# Patient Record
Sex: Male | Born: 1960 | Race: White | Marital: Single | Smoking: Never smoker
Health system: Southern US, Community
[De-identification: ages and names within clinical notes are randomized; demographics above are authoritative.]

---

## 2014-12-12 ENCOUNTER — Other Ambulatory Visit: Payer: Self-pay | Admitting: Family Medicine

## 2014-12-12 ENCOUNTER — Ambulatory Visit
Admission: RE | Admit: 2014-12-12 | Discharge: 2014-12-12 | Disposition: A | Discharge status: Home / Self Care | Payer: Managed Care, Other (non HMO) | Source: Ambulatory Visit | Attending: Family Medicine | Admitting: Family Medicine

## 2014-12-12 DIAGNOSIS — M79605 Pain in left leg: Secondary | ICD-10-CM

## 2014-12-12 DIAGNOSIS — M7918 Myalgia, other site: Secondary | ICD-10-CM

## 2016-03-22 ENCOUNTER — Ambulatory Visit (INDEPENDENT_AMBULATORY_CARE_PROVIDER_SITE_OTHER): Payer: Managed Care, Other (non HMO) | Admitting: Sports Medicine

## 2016-03-22 ENCOUNTER — Encounter: Payer: Self-pay | Admitting: Sports Medicine

## 2016-03-22 VITALS — BP 154/92 | HR 69 | Ht 71.0 in | Wt 185.0 lb

## 2016-03-22 DIAGNOSIS — M5416 Radiculopathy, lumbar region: Secondary | ICD-10-CM | POA: Diagnosis not present

## 2016-03-22 NOTE — Progress Notes (Signed)
Subjective:    Patient ID: Leroy Tyler, male    DOB: 07/31/1961, 55 y.o.   MRN: [id number]  HPI   CC: left buttock pain  Leroy Tyler is a 55-y.o. male who presents with a 2-year history of left buttock pain.  Symptoms first began in 2015; he denies any trauma or inciting injury related to pain's onset.  Since that time, he has attempted to treat his symptoms with physical therapy, massage therapy, dry needling, and NSAIDs.  Over the past 3 weeks, he feels that his symptoms have not gotten any better despite these treatments.  Associated symptoms include occasional numbness and tingling, which radiates from his buttock down into his left calf, foot, and toes.  He denies any overt low back pain.  Symptoms are worsened by bending forward (i.e., bending over to put his pants on in the morning) and bending down to get into his car.  He enjoys playing tennis and hiking, though he notices that his pain and paresthesias are often exacerbated during the first 15 to 20 minutes of a hike.  He additionally reports that he has previously been told that he may have a limb length discrepancy; his physical therapist has noticed that his left shoulder appears to sit higher than his right shoulder.  Denies swelling of the left lower extremity, bruising, or erythema.  No bowel or bladder dysfunction.  Currently take Naproxen (1 to 2 tablets per day) with some symptom relief.  Past medical history significant for HTN. Past surgical history unremarkable. Medications reviewed. Allergies reviewed.   Review of Systems As noted in HPI.    Objective:   Physical Exam  Well-developed, well-nourished.  Alert and oriented x3.  Vital signs reviewed.  Left hip: Normal seated range of motion without discomfort.  Left lower extremity is otherwise neurovascularly intact. Lumbar spine:  Full range of motion without discomfort.  Mild tenderness to palpation over left SI joint and down into left buttock.  No palpable spasm of  the paraspinal muscles.  Negative seated straight leg raise bilaterally. Neuro: Patellar tendon and Achilles tendon reflexes 1+ bilaterally.  Quadriceps, hamstrings, and calf pain 5/5 bilaterally.  Diminished extensor hallucis longus strength on left compared to right, indicative of potential L5 nerve root involvement.  Light touch sensation intact and equal. Pes cavus bilaterally.  X-rays of the lumbar spine from February 2016 are fairly unremarkable. He does have some mild facet arthropathy in the lower lumbar spine but otherwise no significant degenerative changes are seen. Disc spaces are well preserved.    Assessment & Plan:   Left buttock pain likely secondary to lumbar radiculopathy  Advised patient to continue physical therapy with focus on lumbar extension exercises and avoidance of lumbar flexion; given new script to take to his physical therapist.  Discussed potential for additional imaging (MRI) in the future if his symptoms do not improve, given that imaging at this time would not change our management of his symptoms.  He may continue to use Naproxen for his symptoms until his next visit in clinic. He was cautioned about potential GI upset.  Patient would likely also benefit from orthotic insoles at some point given his high arches on exam; he plans to purchase insoles at [employer] later today.  Return to clinic in 3 to 4 weeks for follow-up.  Jenna E. Anola Gurney, M.D.   Patient seen and evaluated with the resident. I agree with the above plan of care. Patient will continue with physical therapy but will avoid  flexion exercises. May continue with other activity using pain as his guide. Follow-up with me in 3-4 weeks. If symptoms persist, then I would consider further diagnostic imaging in anticipation of referral for possible lumbar ESI. Total time spent with the patient was 30 minutes with greater than 50% of the time spent in face-to-face consultation discussing his diagnosis and  treatment.

## 2016-03-29 ENCOUNTER — Ambulatory Visit: Payer: Managed Care, Other (non HMO) | Admitting: Sports Medicine

## 2016-04-19 ENCOUNTER — Encounter: Payer: Self-pay | Admitting: Sports Medicine

## 2016-04-19 ENCOUNTER — Ambulatory Visit (INDEPENDENT_AMBULATORY_CARE_PROVIDER_SITE_OTHER): Payer: Managed Care, Other (non HMO) | Admitting: Sports Medicine

## 2016-04-19 VITALS — BP 134/103 | Ht 71.0 in | Wt 185.0 lb

## 2016-04-19 DIAGNOSIS — M5416 Radiculopathy, lumbar region: Secondary | ICD-10-CM

## 2016-04-19 NOTE — Progress Notes (Signed)
   Subjective:    Patient ID: Leroy AprilScott Tyler, male    DOB: 1960/12/29, 55 y.o.   MRN: [id number]  HPI   Patient comes in today for follow-up on lumbar radiculopathy. Overall, he states that he feels about 75% better since his office visit on May 15. He has been concentrating on a home [employer] exercise program and, as a result, his pain has improved quite a bit. He still gets some occasional numbness and tingling along the lateral aspect of his left lower leg and into his foot but it does not last long. He has not noticed any weakness. Overall, he is pleased with his progress to date.    Review of Systems     Objective:   Physical Exam  Well-developed, well-nourished. Sitting comfortably In exam room.  Negative straight leg raise bilaterally. There is still a slight amount of weakness with great toe extension on the left (4+/5) when compared to the right but it is improved from his previous visit. No atrophy. Sensation is intact to light touch grossly.      Assessment & Plan:   Improving low back pain and left leg sciatica likely secondary to lumbar radiculopathy  Patient has improved by about 75% with a [employer]enzie home exercise program. For the time being he would like to just continue with his home exercises and I think that is certainly reasonable. We did discuss the possibility of formal physical therapy, acupuncture, and chiropractic treatment down the road if needed. We also discussed the possibility of an MRI if symptoms do not continue to improve. I think he is okay to continue with activity as tolerated but will avoid repetitive or prolonged lumbar flexion. Patient will follow-up with me as needed.

## 2016-04-26 ENCOUNTER — Ambulatory Visit: Payer: Managed Care, Other (non HMO) | Admitting: Sports Medicine

## 2017-12-26 ENCOUNTER — Other Ambulatory Visit: Payer: Self-pay | Admitting: Family Medicine

## 2017-12-26 DIAGNOSIS — I779 Disorder of arteries and arterioles, unspecified: Secondary | ICD-10-CM

## 2017-12-26 DIAGNOSIS — I739 Peripheral vascular disease, unspecified: Principal | ICD-10-CM

## 2017-12-30 ENCOUNTER — Ambulatory Visit
Admission: RE | Admit: 2017-12-30 | Discharge: 2017-12-30 | Disposition: A | Discharge status: Home / Self Care | Payer: Managed Care, Other (non HMO) | Source: Ambulatory Visit | Attending: Family Medicine | Admitting: Family Medicine

## 2017-12-30 DIAGNOSIS — I739 Peripheral vascular disease, unspecified: Principal | ICD-10-CM

## 2017-12-30 DIAGNOSIS — I779 Disorder of arteries and arterioles, unspecified: Secondary | ICD-10-CM

## 2019-06-08 ENCOUNTER — Other Ambulatory Visit: Payer: Self-pay

## 2019-06-08 DIAGNOSIS — Z20822 Contact with and (suspected) exposure to covid-19: Secondary | ICD-10-CM

## 2019-06-10 LAB — NOVEL CORONAVIRUS, NAA: SARS-CoV-2, NAA: DETECTED — AB

## 2020-04-22 ENCOUNTER — Ambulatory Visit (INDEPENDENT_AMBULATORY_CARE_PROVIDER_SITE_OTHER): Payer: 59 | Admitting: Sports Medicine

## 2020-04-22 ENCOUNTER — Other Ambulatory Visit: Payer: Self-pay

## 2020-04-22 VITALS — BP 138/88 | Ht 70.0 in | Wt 180.0 lb

## 2020-04-22 DIAGNOSIS — G8929 Other chronic pain: Secondary | ICD-10-CM

## 2020-04-22 DIAGNOSIS — M25512 Pain in left shoulder: Secondary | ICD-10-CM

## 2020-04-22 NOTE — Progress Notes (Addendum)
   Leroy Tyler is a 59 y.o. male who presents to [facility] today for the following:  Left shoulder pain Patient presenting for new onset left shoulder pain.  Has been present for 6 months now.  States he denies any trauma to the area but did notice that around that time he was increasing his push-ups daily.  Due to coronavirus he was unable to go to the gym so has been doing more home workouts.  States that it is not painful all the time but only when he moves his arm up above 90 degrees.  States it is around his triceps.  Has not tried any over-the-counter medications as he does not like to take pills.  Has not used any muscle rub creams.  Has not tried any ice or heat.  Plays golf a few times a year but is right-handed.  Does play tennis weekly but is right-handed and has a right single handed backswing so only uses his left arm to toss the ball up for service.  Did move in March 2020 but that was before the pain started.  PMH reviewed. None pertinent  ROS as above. Medications reviewed.  Exam:  BP 138/88   Ht 5\' 10"  (1.778 m)   Wt 180 lb (81.6 kg)   BMI 25.83 kg/m  Gen: Well NAD MSK: Shoulder: Inspection reveals no obvious deformity, atrophy, or asymmetry. No bruising. No swelling Palpation is normal with no TTP over [facility] joint or bicipital groove. Full ROM in flexion, abduction, somewhat limited external rotation of left arm. Limited internal rotation  NV intact distally Normal scapular function observed. Special Tests:  - Impingement: Neg Hawkins, neers, empty can sign. - Supraspinatous: Negative empty can.  5/5 strength with resisted flexion at 20 degrees - Infraspinatous/Teres Minor: 5/5 strength with ER - Subscapularis: 5/5 strength with IR - Biceps tendon: Negative Speeds, Yerrgason's  - Labrum: Negative Obriens - AC Joint: Negative cross arm - Negative apprehension test - No painful arc and no drop arm sign  Assessment and Plan: 1) Left shoulder pain Patient presenting with  left shoulder pain.  Normal range of motion and good strength.  Rotator cuff special testing was negative so unlikely the etiology.  Likely arthritis versus adhesive capsulitis especially given limited external rotation.  Will need x-ray imaging to differentiate.  Advised home exercise program at this time.  Advised that he avoid any weightbearing exercises at this time.  Advised follow-up in 1 month.  [facility]. [facility] plans to call patient with x-ray results to inform them of further recommendations.   Margaretha Sheffield, PGY-3 [facility] Resident  04/22/2020 2:50 PM   Patient seen and evaluated with the resident.  I agree with the above plan of care.  Patient definitely has decreased external rotation to suggest either glenohumeral osteoarthritis or adhesive capsulitis.  We will get an x-ray to rule out osteoarthritis and I will call him with those results when available.  If unremarkable, then we could consider ultrasound to rule out rotator cuff pathology although his clinical exam today does not suggest that.  Alternatively, we could consider formal physical therapy as well.  Addendum: X-rays unremarkable.  No evidence of glenohumeral osteoarthritis.  Patient will start a home exercise program to help increase range of motion.  Follow-up in 4 weeks.  If no improvement, consider formal physical therapy.

## 2020-04-22 NOTE — Assessment & Plan Note (Signed)
Patient presenting with left shoulder pain.  Normal range of motion and good strength.  Rotator cuff special testing was negative so unlikely the etiology.  Likely arthritis versus adhesive capsulitis especially given limited external rotation.  Will need x-ray imaging to differentiate.  Advised home exercise program at this time.  Advised that he avoid any weightbearing exercises at this time.  Advised follow-up in 1 month.  Dr. Margaretha Sheffield plans to call patient with x-ray results to inform them of further recommendations.

## 2020-04-23 ENCOUNTER — Ambulatory Visit
Admission: RE | Admit: 2020-04-23 | Discharge: 2020-04-23 | Disposition: A | Discharge status: Home / Self Care | Payer: 59 | Source: Ambulatory Visit | Attending: Sports Medicine | Admitting: Sports Medicine

## 2020-04-23 DIAGNOSIS — M25512 Pain in left shoulder: Secondary | ICD-10-CM

## 2020-05-29 ENCOUNTER — Ambulatory Visit: Payer: 59 | Admitting: Sports Medicine

## 2020-06-10 ENCOUNTER — Ambulatory Visit (INDEPENDENT_AMBULATORY_CARE_PROVIDER_SITE_OTHER): Payer: 59 | Admitting: Sports Medicine

## 2020-06-10 ENCOUNTER — Other Ambulatory Visit: Payer: Self-pay

## 2020-06-10 ENCOUNTER — Telehealth: Payer: 59 | Admitting: Sports Medicine

## 2020-06-10 VITALS — BP 126/86 | Ht 71.0 in | Wt 185.0 lb

## 2020-06-10 DIAGNOSIS — M5489 Other dorsalgia: Secondary | ICD-10-CM

## 2020-06-10 MED ORDER — MELOXICAM 15 MG PO TABS
ORAL_TABLET | ORAL | 0 refills | Status: AC
Start: 2020-06-10 — End: ?

## 2020-06-10 NOTE — Progress Notes (Signed)
   Subjective:    Patient ID: Frederic Tones, male    DOB: [birth year], 59 y.o.   MRN: [id number]  HPI Mr. Weisgerber is a 59 yo M with a history of disc herniation with lumbar radiculopathy here for pain in both of his hamstrings. He states the pain started on July 9th when he drove down 8 hours to [place] and accidentally had the lumbar support in his carseat fully extended the entire time. The following day, he noticed his lower back and hamstrings hurt. He saw his PCP on July 16th who said it might have been a pinched nerve and prescribed prednisone, which helped the pain. Currently, he feels his hamstrings are about 60-70% better, with no pain at rest but soreness with certain movements such as standing for long periods of time or getting up from a seated position. He also has soreness when he leans back on his yoga ball. Denies numbness or tingling down his legs, radiation of the pain to his back or down the legs, urinary or fecal incontinence.   Review of Systems As above.    Objective:   Physical Exam Gen: well-appearing, in no acute distress, sitting comfortably on exam table Resp: breathing comfortably at rest MSK: 5/5 strength with hip flexion, extension, abduction, and adduction bilaterally, 5/5 strength with dorsiflexion, plantarflexion, and extension of halluces bilaterally. Trace patellar reflexes bilaterally, 2+ Achilles reflexes bilaterally. Pain in lower back and hamstrings with back extension, no pain with back flexion. Pain in hamstrings with standing up from exam table.  Lumbar spine x-ray in 2016 showed mild facet disease in the lower lumbar spine.    Assessment & Plan:  Mr. Duthie is a 59 yo M with a history of disc herniation with lumbar radiculopathy here for bilateral hamstring pain after a long drive with his back extended from his lumbar support, found to have pain worse with back extension. 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 Given the pain worse with back extension and previous evidence of facet disease on previous x-ray, patient's symptoms are likely due to facet arthropathy rather than disc herniation, likely caused by the drive down to [place] with his lumbar support extending his lower back for several hours. Patient also does not have radicular symptoms like he had with his previous disc herniation.   Plan: -Recommend William's flexion exercises for lumbar spine -Will prescribe meloxicam 15 mg daily as needed for pain -Follow-up as needed

## 2021-06-15 ENCOUNTER — Other Ambulatory Visit (HOSPITAL_COMMUNITY): Payer: Self-pay | Admitting: Family Medicine

## 2021-06-17 ENCOUNTER — Ambulatory Visit (HOSPITAL_COMMUNITY): Payer: 59

## 2021-06-24 ENCOUNTER — Ambulatory Visit (HOSPITAL_COMMUNITY)
Admission: RE | Admit: 2021-06-24 | Discharge: 2021-06-24 | Disposition: A | Discharge status: Home / Self Care | Payer: 59 | Source: Ambulatory Visit | Attending: Family Medicine | Admitting: Family Medicine

## 2021-06-24 ENCOUNTER — Other Ambulatory Visit: Payer: Self-pay

## 2021-06-24 DIAGNOSIS — Z136 Encounter for screening for cardiovascular disorders: Secondary | ICD-10-CM | POA: Insufficient documentation

## 2021-06-24 DIAGNOSIS — E785 Hyperlipidemia, unspecified: Secondary | ICD-10-CM | POA: Insufficient documentation

## 2023-03-30 IMAGING — CT CT CARDIAC CORONARY ARTERY CALCIUM SCORE
2 series · 16 of 20 positions shown, 18 images · non-contrast
Comparison: None.
COMPARISON: None.

Addendum:
EXAM:
OVER-READ INTERPRETATION  CT CHEST

The following report is an over-read performed by radiologist Dr.
Bajso Nannu [REDACTED] on 06/24/2021. This over-read
does not include interpretation of cardiac or coronary anatomy or
pathology. The calcium score interpretation by the cardiologist is
attached.
CLINICAL DATA: Risk stratification
Coronary Calcium Score
TECHNIQUE: The patient was scanned on a Siemens Somatom 64 slice scanner. Axial
non-contrast 3 mm slices were carried out through the heart. The
data set was analyzed on a dedicated work station and scored using
the Agatson method.

[Series 3: 2 hrt calcium · axial · 0.35mm/px · z∈[-276,-183]mm · 8 of 41 slices shown, 10 images]
[im 5/41  vessel]
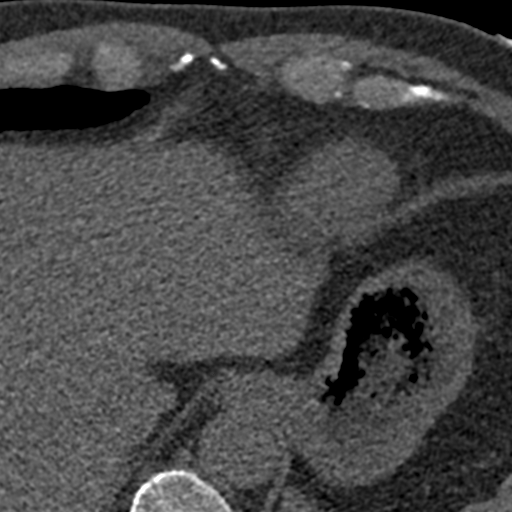
[im 5/41  lung]
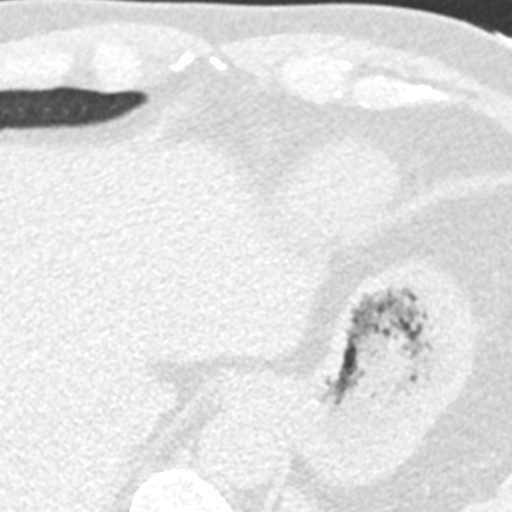
[im 9/41  vessel]
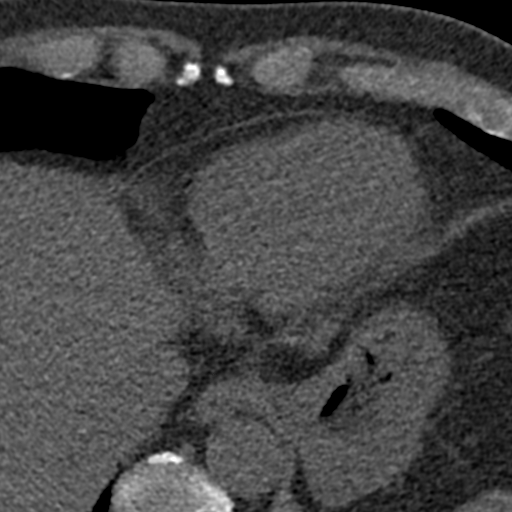
[im 14/41  vessel]
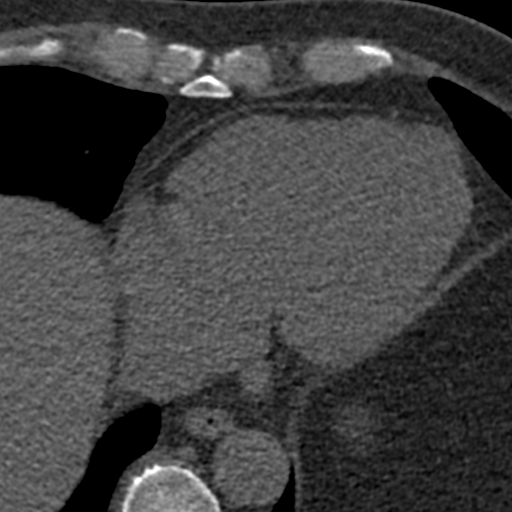
[im 18/41  vessel]
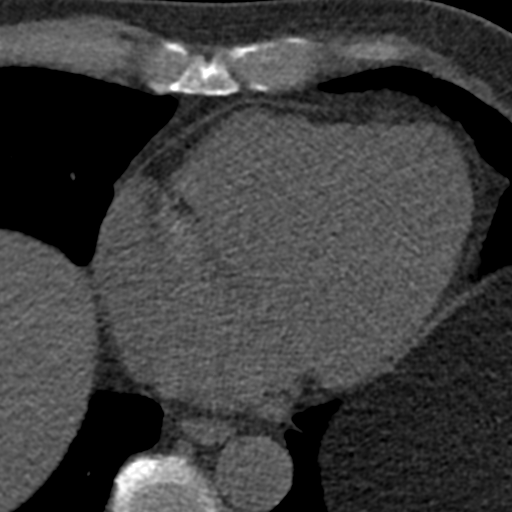
[im 23/41  vessel]
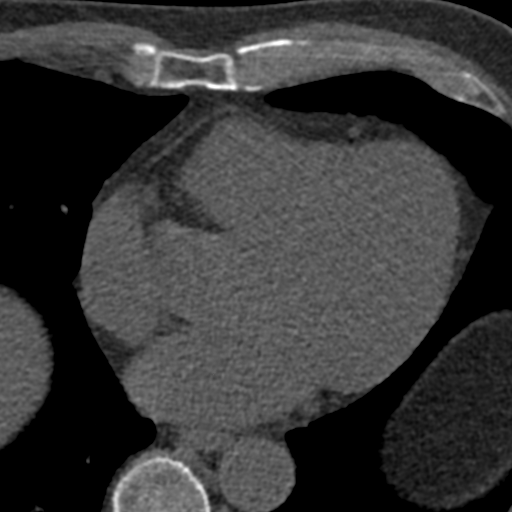
[im 23/41  lung]
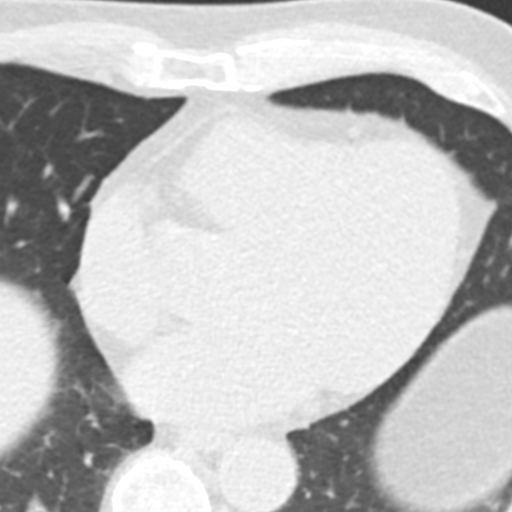
[im 27/41  vessel]
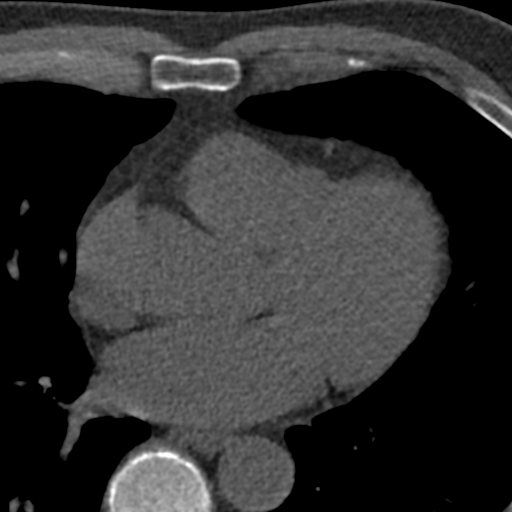
[im 32/41  vessel]
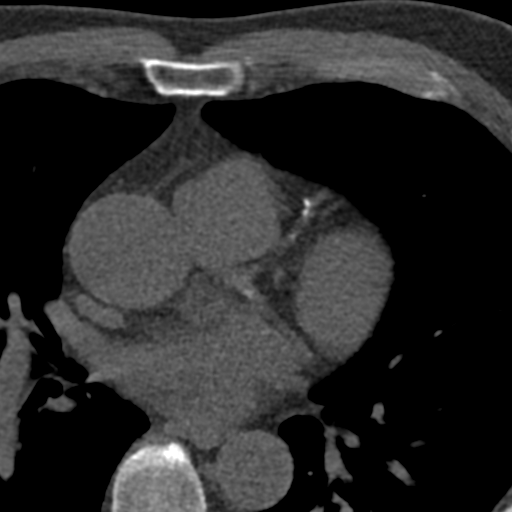
[im 36/41  vessel]
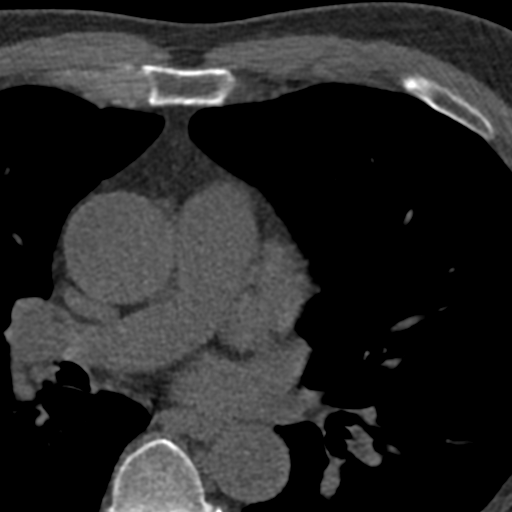

[Series 5: 2 soft full fov 72 % · axial · 0.67mm/px · z∈[-288,-184]mm · 8 of 45 slices shown]
[im 5/45  vessel]
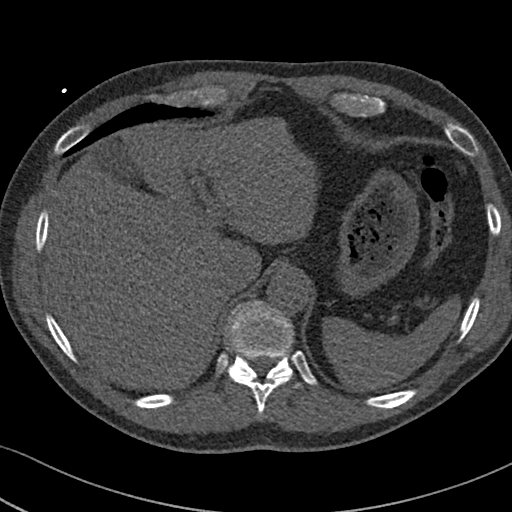
[im 10/45  vessel]
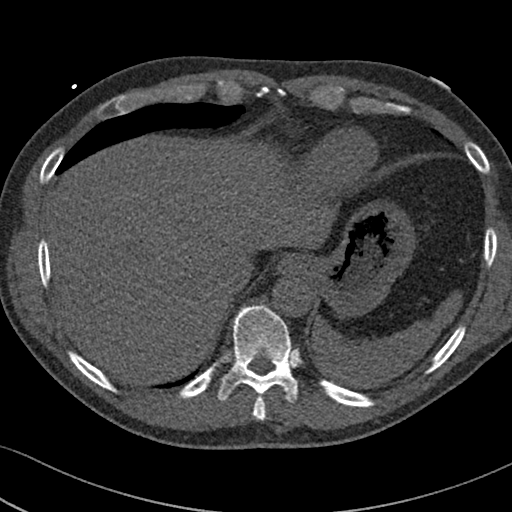
[im 15/45  vessel]
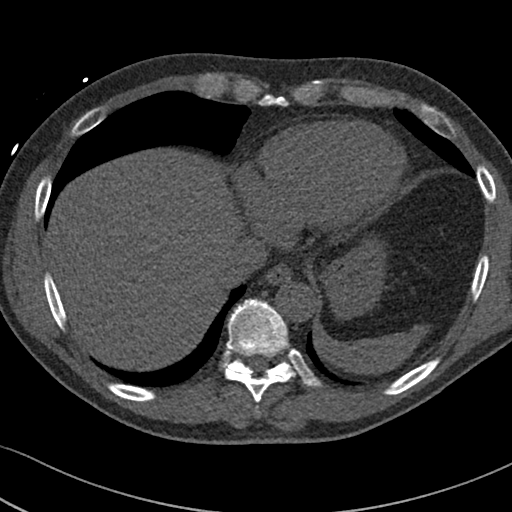
[im 20/45  vessel]
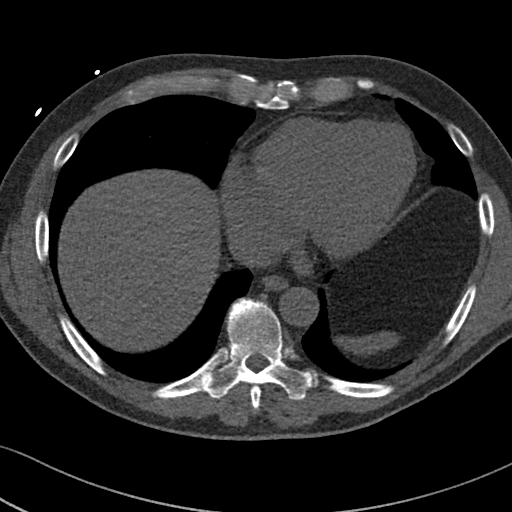
[im 25/45  vessel]
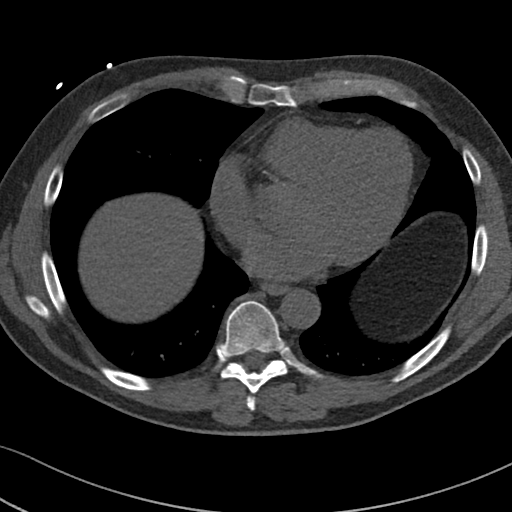
[im 30/45  vessel]
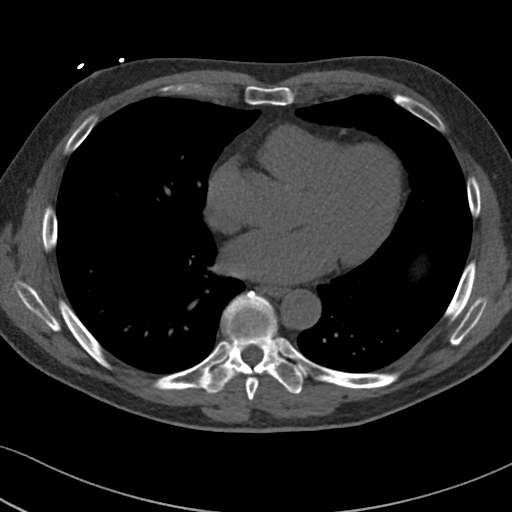
[im 35/45  vessel]
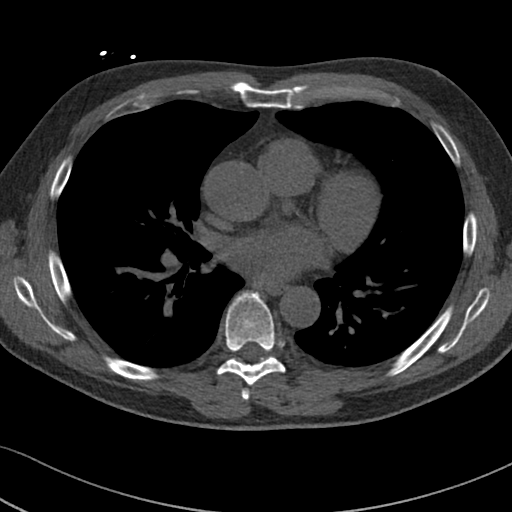
[im 40/45  vessel]
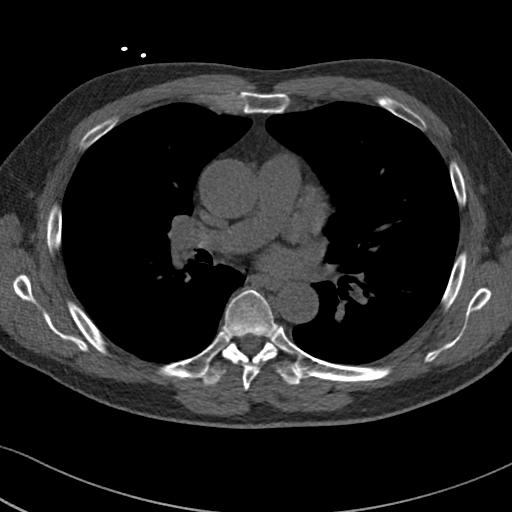

[16 of 20 positions shown; findings below may reference images not displayed]

FINDINGS: Vascular: Normal aortic caliber.

Mediastinum/Nodes: No imaged thoracic adenopathy.

Lungs/Pleura: No pleural fluid. Right lower lobe 3 mm calcified
granuloma.

Upper Abdomen: Normal imaged portions of the liver, spleen, stomach,
gallbladder.

Musculoskeletal: No acute osseous abnormality.
IMPRESSION: No acute findings in the imaged extracardiac chest.
FINDINGS: Non-cardiac: See separate report from [REDACTED].

Ascending aorta: Dilated 3.9 cm

Pericardium: Normal

Coronary arteries: Punctate calcium noted in all 3 major epicardial
vessels

LM: 0

LAD:

Circumflex:

RCA:

Total
IMPRESSION: Coronary calcium score of 60.9. This was 54 th percentile for age
and sex matched control.

Eyad Botros

*** End of Addendum ***
EXAM:
OVER-READ INTERPRETATION  CT CHEST

The following report is an over-read performed by radiologist Dr.
Bajso Nannu [REDACTED] on 06/24/2021. This over-read
does not include interpretation of cardiac or coronary anatomy or
pathology. The calcium score interpretation by the cardiologist is
attached.
FINDINGS: Vascular: Normal aortic caliber.

Mediastinum/Nodes: No imaged thoracic adenopathy.

Lungs/Pleura: No pleural fluid. Right lower lobe 3 mm calcified
granuloma.

Upper Abdomen: Normal imaged portions of the liver, spleen, stomach,
gallbladder.

Musculoskeletal: No acute osseous abnormality.
IMPRESSION: No acute findings in the imaged extracardiac chest.

## 2024-05-29 ENCOUNTER — Ambulatory Visit
Admission: RE | Admit: 2024-05-29 | Discharge: 2024-05-29 | Disposition: A | Discharge status: Home / Self Care | Source: Ambulatory Visit | Attending: Family Medicine | Admitting: Family Medicine

## 2024-05-29 ENCOUNTER — Other Ambulatory Visit: Payer: Self-pay | Admitting: Family Medicine

## 2024-05-29 DIAGNOSIS — Q742 Other congenital malformations of lower limb(s), including pelvic girdle: Secondary | ICD-10-CM
